# Patient Record
Sex: Male | Born: 1970 | Race: Black or African American | Hispanic: No | Marital: Married | Smoking: Current every day smoker
Health system: Southern US, Community
[De-identification: ages and names within clinical notes are randomized; demographics above are authoritative.]

## PROBLEM LIST (undated history)

## (undated) DIAGNOSIS — K219 Gastro-esophageal reflux disease without esophagitis: Secondary | ICD-10-CM

---

## 2002-08-02 ENCOUNTER — Emergency Department (HOSPITAL_COMMUNITY): Admission: EM | Admit: 2002-08-02 | Discharge: 2002-08-02 | Payer: Self-pay | Admitting: Emergency Medicine

## 2005-06-26 ENCOUNTER — Emergency Department (HOSPITAL_COMMUNITY): Admission: EM | Admit: 2005-06-26 | Discharge: 2005-06-26 | Payer: Self-pay | Admitting: Emergency Medicine

## 2006-09-11 ENCOUNTER — Ambulatory Visit: Payer: Self-pay | Admitting: Gastroenterology

## 2006-10-03 ENCOUNTER — Ambulatory Visit: Payer: Self-pay | Admitting: Gastroenterology

## 2008-05-10 ENCOUNTER — Inpatient Hospital Stay (HOSPITAL_COMMUNITY): Admission: EM | Admit: 2008-05-10 | Discharge: 2008-05-13 | Payer: Self-pay | Admitting: Emergency Medicine

## 2009-07-21 IMAGING — CR DG CHEST 2V
2 series · 2 of 2 positions shown · non-contrast
Comparison: None.

CLINICAL DATA: Smoker with shortness of breath.

CHEST - 2 VIEW

[w chest pa]
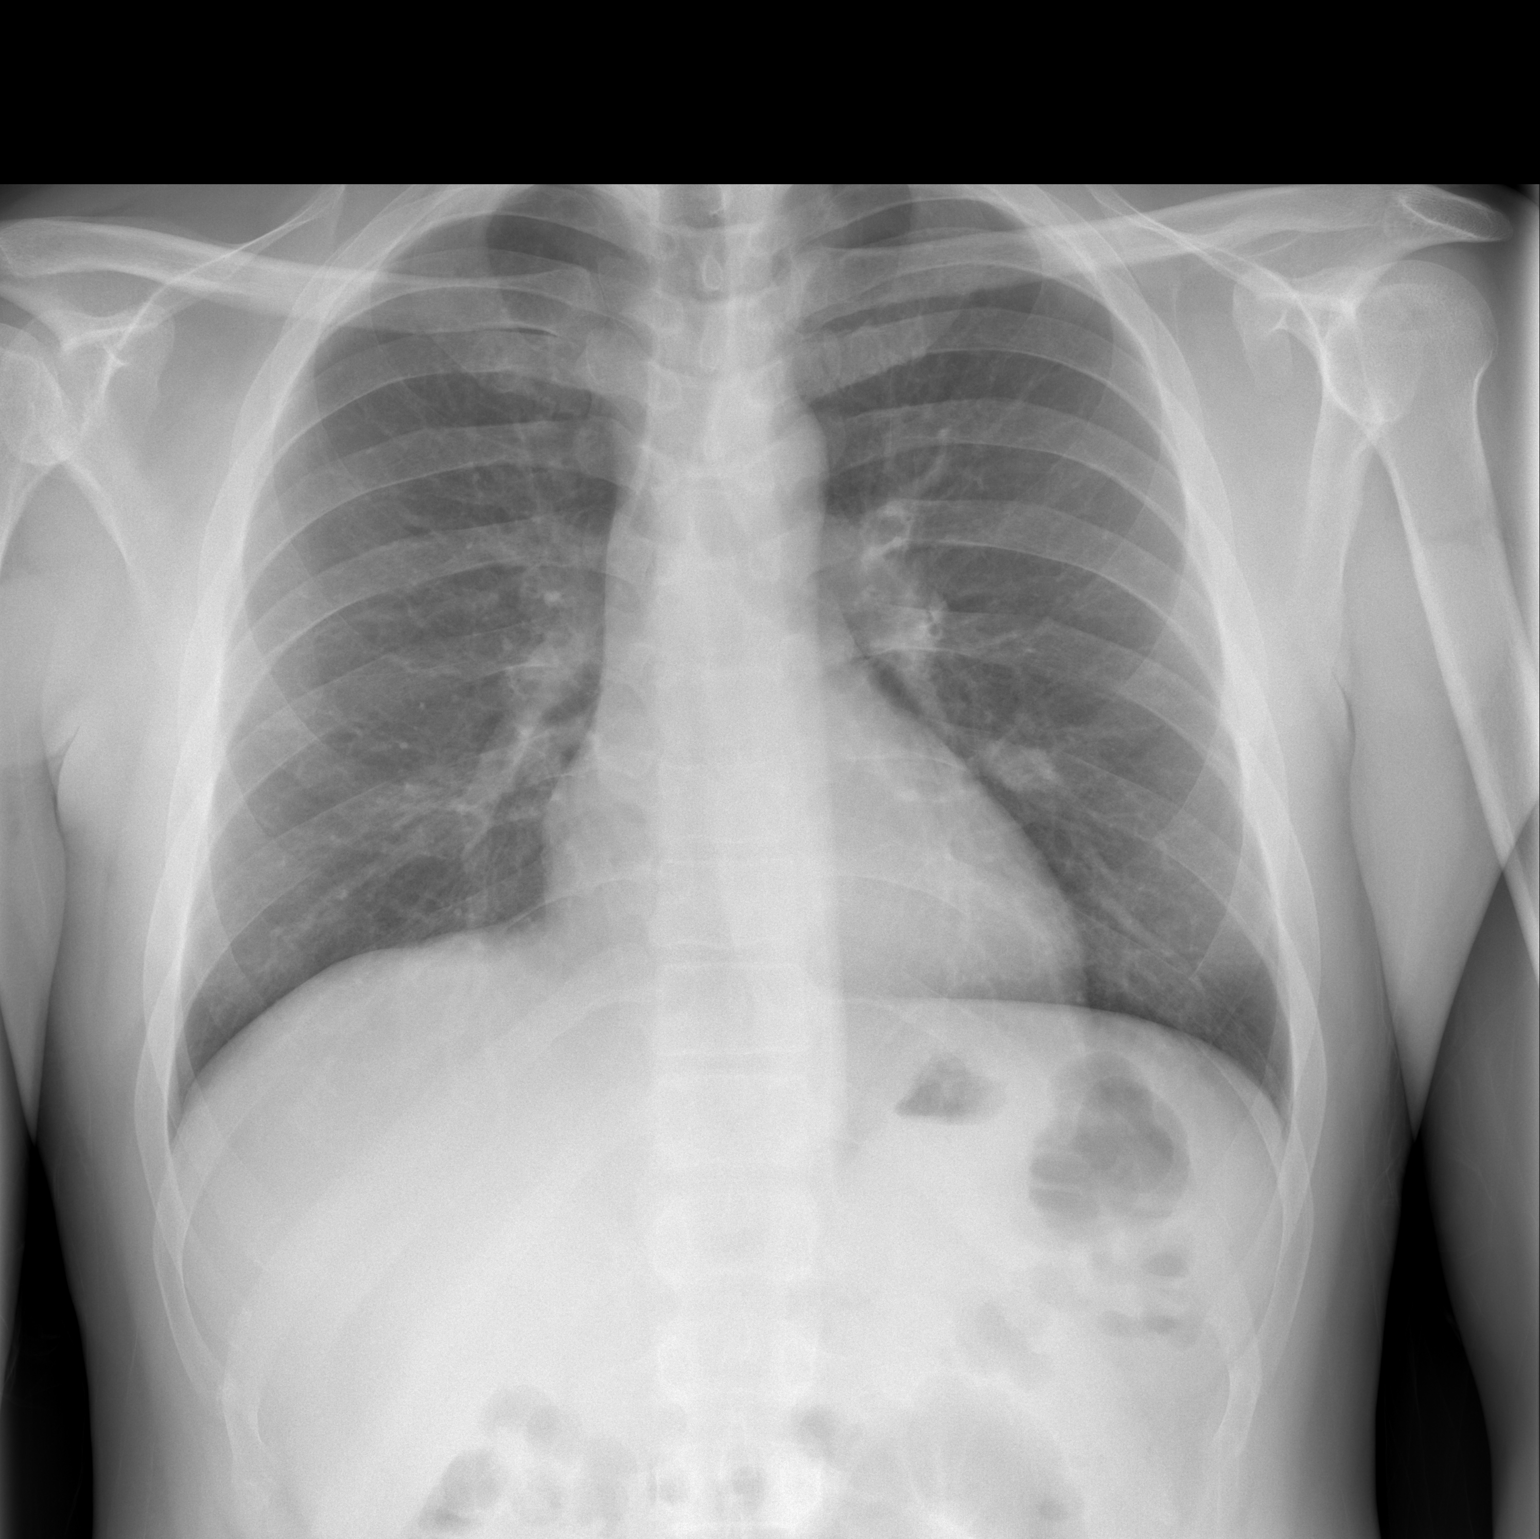

[w chest lat]
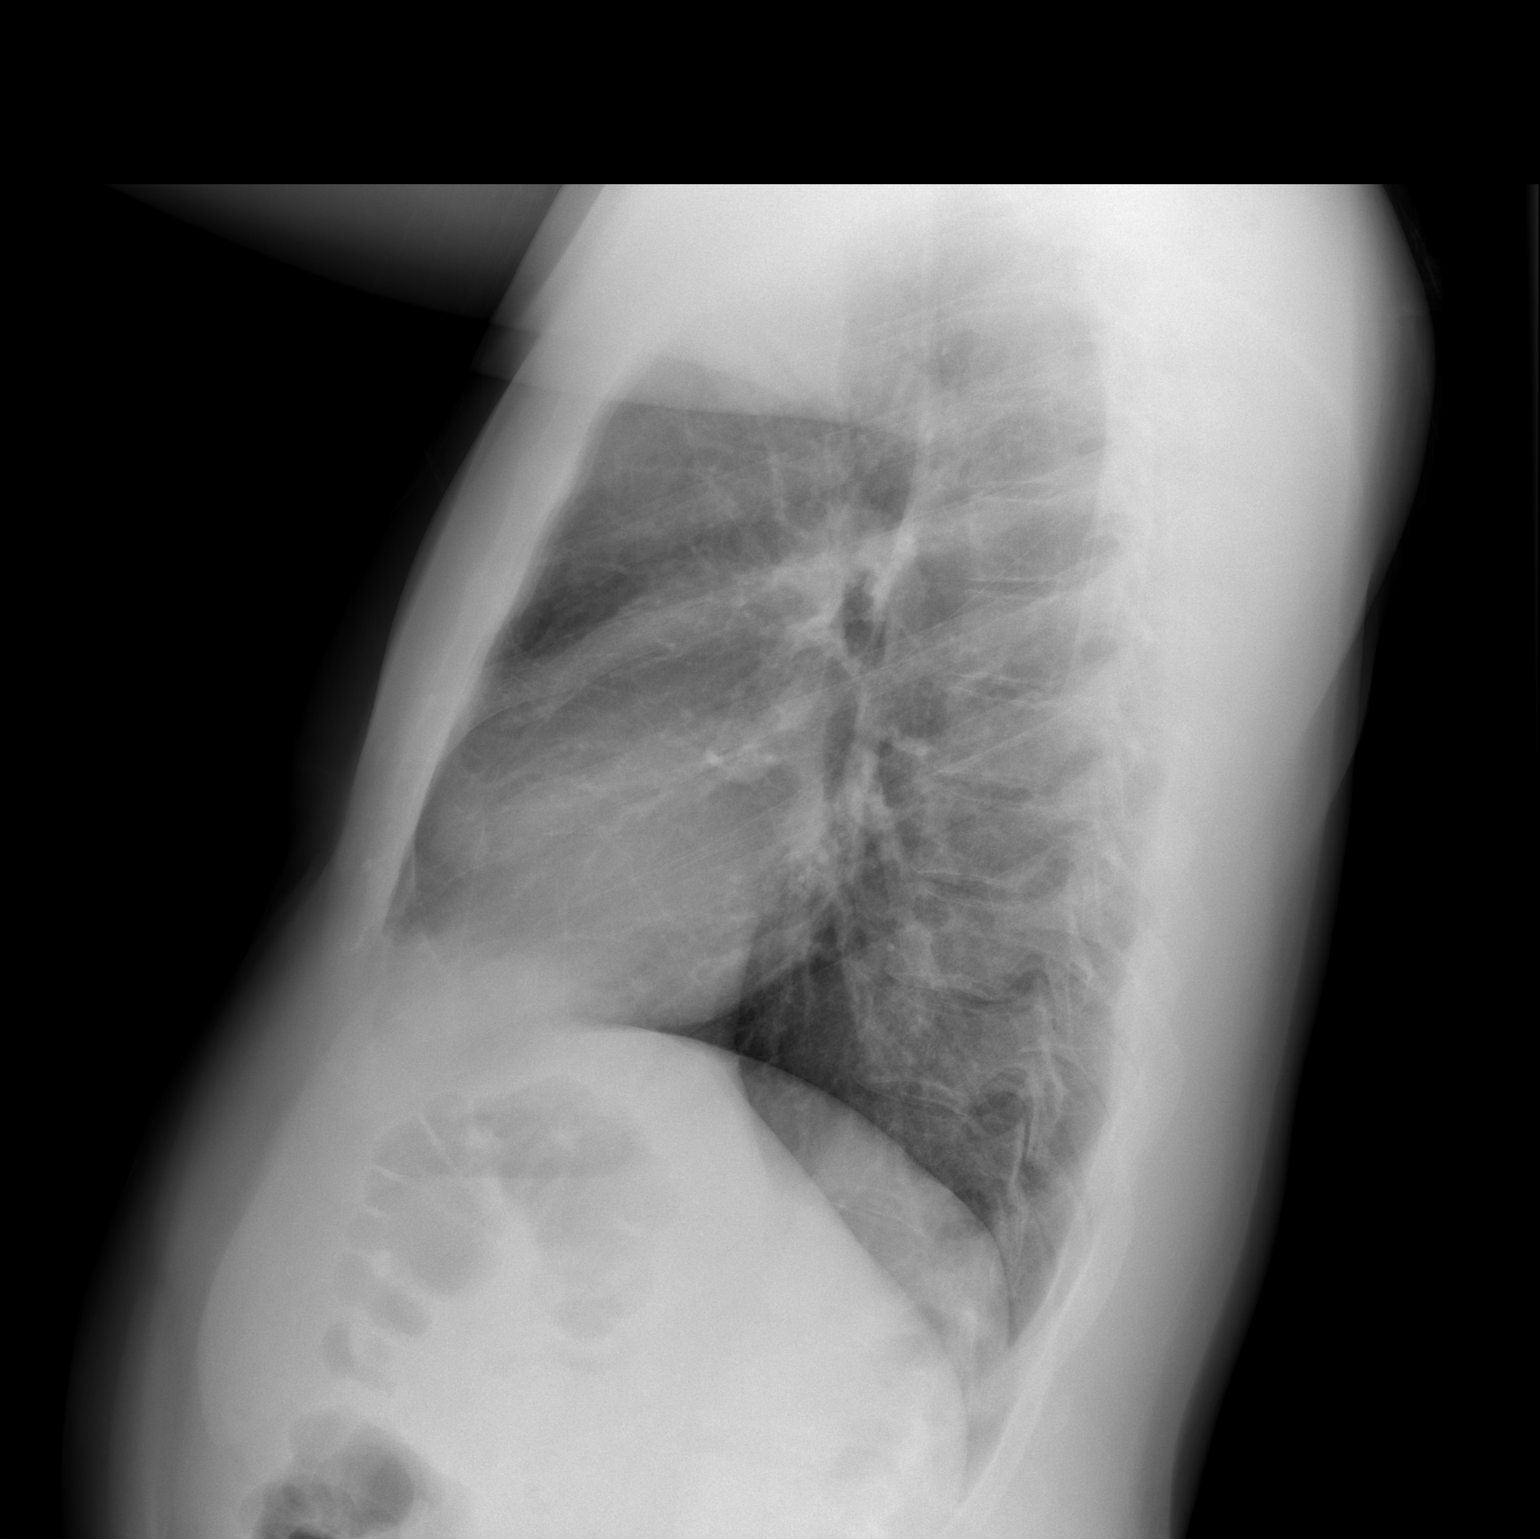

[2 of 2 positions shown; findings below may reference images not displayed]

FINDINGS: Normal sized heart.  Clear lungs.  Mild central
peribronchial thickening.  Unremarkable bones.
IMPRESSION: Mild bronchitic changes.

## 2011-05-07 NOTE — H&P (Signed)
NAMEHERMILO, DUTTER NO.:  [id number]   MEDICAL RECORD NO.:  [id number]          PATIENT TYPE:  EMS   LOCATION:  ED                           FACILITY[facility]  [facility]   PHYSICIAN:  Altha Harm, MDDATE OF BIRTH:  10/14/1971   DATE OF ADMISSION:  05/09/2008  DATE OF DISCHARGE:                              HISTORY & PHYSICAL   CHIEF COMPLAINT:  Abdominal pain.   HISTORY OF PRESENT ILLNESS:  This is a 40 year old gentleman who  presents to the emergency room today with complaints of crampy abdominal  pain in the periumbilical area.  The patient states that the pain is  intermittent and at the worse is at 10/10, and is now down to a 3/10.  The patient states the pain is not radiating and is not associated with  any other symptoms.  He is unable to identify any palliative or  provocative features associated with the pain.  There is no association  with eating; no association with bowel movements or urination.  The  patient has had normal formed bowel movements up until today, without  any overt evidence of blood.  He has no dysuria.  The patient states  that he has had similar episodes of this in the past, the last occurring  approximately 1 month ago.  However, the pain abated on its own without  necessity for any further medical intervention.  However, he states that  the difference today is that the pain is continuous and is excruciating,  at its greatest intensity.  There are no sick contacts at home.   PAST MEDICAL HISTORY:  Significant for gastroesophageal reflux disease.   FAMILY HISTORY:  Significant for colon cancer in an uncle.  Hypertension  in his mother.  Coronary artery disease and diabetes which caused his  father's death at age 74.   SOCIAL HISTORY:  Significant for tobacco, one pack per day times 16  years.  He drinks alcohol, approximately 48 ounces of beer per day.  He  does use marijuana, the last use being today.  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   MEDICATIONS:  Currently he is not on any medications.  He uses Mylanta  intermittently for dyspepsia.   ALLERGIES:  NO KNOWN DRUG ALLERGIES.   PRIMARY CARE PHYSICIAN:  None.   REVIEW OF SYSTEMS:  The 14 systems were reviewed, all systems are  negative except as noted in the HPI.   STUDIES:  Done in the emergency room and show the following:  The  patient has a white blood cell count of 16.7, hemoglobin 16.9,  hematocrit 48.3, platelet count 267.  Sodium 142, potassium 4.6,  chloride 107, bicarb 27, BUN 10, creatinine 1.23, and a blood glucose  109.  A urinalysis was negative for any elements consistent with a  urinary tract infection.  A CT of the abdomen and pelvis without  contrast shows no acute abnormalities.  A chest x-ray shows mild  bronchitic changes.   PHYSICAL EXAMINATION:  The patient is resting in bed comfortably and  appears in no distress.  The patient is also nontoxic appearing and is  able to answer questions without any difficulty.  He does state that he  feels hungry at this time.  VITAL SIGNS:  Temperature 98.2 orally, blood  pressure 128/82, heart rate 88, respiratory rate 15, O2 saturations 96%  on room air.  HEENT EXAMINATION:  He is normocephalic, atraumatic.  Pupils equally  round and reactive to light and accommodation.  Extraocular movements  were intact.  Tympanic membranes were translucent bilaterally with good  landmarks.  Oropharynx was moist.  No exudates or erythema lesions were  noted.  Trachea was midline.  No masses, no thyromegaly, no JVD, no  carotid bruit.  RESPIRATORY EXAMINATION:  the patient has a normal respiratory effort,  equal excursion bilaterally.  No wheezing or rhonchi noted.  CARDIOVASCULAR:  He has a normal S1 and S2; no murmurs, rubs or gallops  are noted.  PMI was nondisplaced.  No heaves or thrills on palpation.  ABDOMINAL EXAMINATION:  The patient's abdomen is obese, soft, nontender,   nondistended.  No masses, no hepatosplenomegaly.  LYMPH NODE SURVEY:  He had no cervical, axillary or inguinal  lymphadenopathy. MUSCULOSKELETAL:  He had no warm swelling or erythema  around the joints.  There was no joint tenderness and no spinal  tenderness noted.  NEUROLOGICAL:  Cranial nerves II-XII were grossly intact.  DTRs were 2+  bilaterally at the lower extremities.  Strength was 5/5 in bilateral  upper and lower extremities.  PSYCHIATRIC:  He was alert and oriented x3; good insight and cognition  and baseline to remote recall.   ASSESSMENT AND PLAN:  This patient presents with abdominal cramping.  I  suspect that this is likely somehow related to peptic ulcer disease  process.  However, the elevated white blood cell count with a left shift  is concerning.  We will admit the patient and observe him overnight.  If  there is no further escalations in his symptoms, I think the patient can  probably be safely be discharged home; as long  as his white blood cell  count resolves and he does not encounter any further clinical  deterioration.  The patient really has no tenderness and he is not  affected by his eating.  The patient does have a strong history of colon  cancer in his family.  He does have risk factors including alcohol and  tobacco use for peptic ulcer disease, and the patient would likely  benefit from an endoscopic evaluation.  However, I am not convinced that  this needs to occur within the hospital; he  will probably be set up as  an outpatient.  We will observe the patient over the next 12-24 hours.  Further decisions on his care will be made by his rounding physician,  based upon the patient's initial response to therapy and the unfolding  of his clinical course.      Altha Harm, MD  Electronically Signed     MAM/MEDQ  D:  05/09/2008  T:  05/09/2008  Job:  [id number]

## 2011-05-07 NOTE — Consult Note (Signed)
NAMEBURGESS, SHERIFF               ACCOUNT NO.:  [id number]   MEDICAL RECORD NO.:  [id number]          PATIENT TYPE:  INP   LOCATION:  1513                         FACILITY:  [facility]   PHYSICIAN:  Shirley Friar, MDDATE OF BIRTH:  03/03/1971   DATE OF CONSULTATION:  DATE OF DISCHARGE:                                 CONSULTATION   REASON FOR CONSULTATION:  Abdominal pain.   HISTORY OF PRESENT ILLNESS:  A 40 year old black male with history of  reflux, being seen secondary to periumbilical abdominal pain.  He  describes his abdominal pain as intermittent, crampy in nature that is  unrelated to eating or bowel movements.  When the pain comes on, it  usually lasts a few seconds to minutes and then resolves for several  weeks to months.  He reports this periumbilical abdominal pain has been  intermittent for the last several months and describes it as like my  guts are being pulled out.  He denies any fevers, chills, weight loss,  diarrhea, melena, hematochezia, nausea or vomiting.  Does take NSAIDs  daily secondary to headaches and has done so for years.  His white blood  count on admission was 16.7 and currently 10.6 with a normal hemoglobin.  He had abdominal pelvic CT scan done on the 18th which was negative for  any acute findings.  He states that he is still having intermittent  abdominal pain that is relieved with pain medicines.   In 2007, he saw Dr. Jarold Motto in [facility] for crampy upper  abdominal pain.  At that time, the thought was that his NSAIDS could be  associated with his symptoms.  He was recommended to have an endoscopy  and colonoscopy.  He says he never got it done because of insurance  problems.   PAST MEDICAL HISTORY:  Gastroesophageal reflux disease.   MEDICATIONS:  NSAIDs daily, Mylanta p.r.n.   ALLERGIES:  NO KNOWN DRUG ALLERGIES.   FAMILY HISTORY:  Uncle had colon cancer.  No other known history of  colon cancer.   SOCIAL HISTORY:  Positive  alcohol 40 ounces of beer per day, positive  marijuana, denies cocaine, positive tobacco.   REVIEW OF SYSTEMS:  Negative from GI standpoint except as stated above.   PHYSICAL EXAMINATION:  VITAL SIGNS:  Temperature 98.2, pulse 50, blood  pressure 113/73, O2 sat 98% on room air.  GENERAL:  Alert, no acute distress.  ABDOMEN:  Soft, nontender, nondistended.  Positive bowel sounds.   LABORATORY DATA:  White blood count 16.7 on May 09, 2008, currently  10.6, hemoglobin 15.5, platelet count 236.  Lipase 21.  All other labs  as listed in hospital record.   IMPRESSION:  A 40 year old black male with intermittent periumbilical  abdominal pain with negative CT scan.  I think his nonsteroidal anti-  inflammatory drug use could be causing irritation of his lining of his  gut and that could be contributing to his pain.  He could have an ulcer  in his lower GI tract.  I do not think he has an upper GI ulcer, or at  least  I do not think that is causing his pain.  He needs to stop all  NSAID use.  His pain is unrelated to eating, and he tolerated a regular  diet on 05/09/2008 in the hospital and therefore will resume his diet  and see how he does.  We will plan to do an upper GI series and small-  bowel follow-through on May 12, 2008.      Shirley Friar, MD  Electronically Signed     VCS/MEDQ  D:  05/11/2008  T:  05/11/2008  Job:  [id number]

## 2011-05-10 NOTE — Assessment & Plan Note (Signed)
[facility]                           GASTROENTEROLOGY OFFICE NOTE   Peter Harrison, Peter Harrison                        MRN:          [id number]  DATE:09/11/2006                            DOB:          1971/05/28    Mr. Peter Harrison is a 40 year old black male, employee of [employer].  He is self-referred today for evaluation of crampy upper abdominal pain  which has been present for several months with mild nausea but no emesis.  He apparently has chronic acid reflux which is managed with p.r.n. antacids,  and was seen at [facility] in 1997, apparently had an  upper GI series.  He denies acid reflux symptoms at this time or extra  esophageal manifestations of GERD.  He specifically denies dysphagia,  anorexia, or weight loss.  His crampy abdominal pain seems to be present  upon waking in the morning and is worse with eating.  He has had no weight  loss, however.  Denies melena or hematochezia.  He has had rectal pain, some  bright red blood per rectum associated with constipation.  He denies  specific food intolerances.  He does use a large amount of NSAIDs because of  headaches and other aches and pains.  He has never had endoscopic exams.   PAST MEDICAL HISTORY:  1. Long history of cigarette and alcohol abuse.  2. Denies previous episodes of hepatitis or pancreatitis.  3. He has never had previous abdominal or general surgical procedures.   FAMILY HISTORY:  Noncontributory in terms of gastrointestinal problems.   MEDICATIONS:  None.   ALLERGIES:  None.   SOCIAL HISTORY:  The patient is married and has 5 children.  He lives with  his current wife and 2 children.  He smoked a pack a day of cigarettes for  some 17 years and drinks a 6-pack a day over the last 10 years.  He also  uses marijuana but denies illicit IV drug use.   REVIEW OF SYSTEMS:  Negative today for any cardiovascular, pulmonary,  genitourinary, neurologic,  orthopedic, endocrine, or neuropsychiatric  problems.  Review of systems otherwise noncontributory.  The patient does  not see physicians, has not had blood work done in many years.   PHYSICAL EXAMINATION:  GENERAL:  He is a healthy-appearing black male  appearing his stated age.  VITAL SIGNS:  He is 5 feet 7 inches tall and weighs 171 pounds.  Blood  pressure is 118/78, and pulse was 80 and regular.  I could not appreciate  stigmata of chronic liver disease or thyromegaly.  CHEST:  Entirely clear to percussion and auscultation anteriorly and  posteriorly.  HEART:  He was in a regular rhythm without murmurs, gallops, or rubs noted.  ABDOMEN:  I could not appreciated hepatosplenomegaly, abdominal masses, or  tenderness.  EXTREMITIES:  Unremarkable.  MENTAL STATUS:  Normal.  RECTAL:  Attempts at rectal exam were unsuccessful because of marked rectal  sensitivity.  I could see no definite fissures or fistulae.   ASSESSMENT:  1. Probable nonsteroidal anti-inflammatory drug-induced gastric ulceration      with  associated abdominal pain.  2. Long history of acid reflux disease with frequent antacid use - rule      out Barrett's mucosa versus esophageal malignancy per his history of      alcohol and cigarette abuse.  3. Rule out cholelithiasis.  4. History of chronic ethanol and cigarette abuse without known sequelae      at this time.  5. Probable rectal fissure associated with constipation.  6. Rectal bleeding secondary probably to #5.  7. History of frequent headaches requiring nonsteroidal anti-inflammatory      drug use.   RECOMMENDATIONS:  1. Check CBC and metabolic profile.  2. Outpatient ultrasound, endoscopy, and colonoscopy.  3. Konsyl kit with local steroid - Xylocaine cream applicators twice      daily.  4. Start Prevacid 30 mg 30 minutes before the first meal of the day.  5. Discontinue nonsteroidal anti-inflammatory drug and aspirin use and      have given him a limited  supply of Darvocet-N 100 to use p.r.n.  The      patient may need referral to headache clinic.                                   Vania Rea. Jarold Motto, MD, Clementeen Graham, [place]   DRP/MedQ  DD:  09/11/2006  DT:  09/12/2006  Job #:  [id number]

## 2011-05-10 NOTE — Discharge Summary (Signed)
Peter Harrison, Peter Harrison               ACCOUNT NO.:  [id number]   MEDICAL RECORD NO.:  [id number]          PATIENT TYPE:  INP   LOCATION:  1513                         FACILITY:  [facility]   PHYSICIAN:  Hillery Aldo, M.D.   DATE OF BIRTH:  10/01/1971   DATE OF ADMISSION:  05/09/2008  DATE OF DISCHARGE:  05/13/2008                               DISCHARGE SUMMARY   PRIMARY CARE PHYSICIAN:  None.   GASTROENTEROLOGIST:  Vania Rea. Jarold Motto, MD, Clementeen Graham, FACP, FAGA.   DISCHARGE DIAGNOSES:  1. Abdominal pain.  2. Leukocytosis.  3. Gastroesophageal reflux disease.  4. Tobacco abuse.  5. Daily alcohol use.  6. Marijuana abuse.   DISCHARGE MEDICATIONS:  1. Bentyl 20 mg q.6 h p.r.n. abdominal pain.  2. Protonix 40 mg daily.  3. Oxycodone 5 mg q.6 h p.r.n. pain.   CONSULTATIONS:  1. Shirley Friar, MD, of gastroenterology.  2. Anselm Pancoast. Zachery Dakins, M.D., of general surgery.   PROCEDURES AND DIAGNOSTIC STUDIES:  1. CT scan of the abdomen and pelvis on May 09, 2008, showed no acute      abdominal or pelvic findings.  2. One-view of the abdomen on May 12, 2008, showed no acute findings.  3. Upper GI with small-bowel follow-through on May 13, 2008, showed a      small hiatal hernia.  No esophageal stricture.  Spontaneous      gastroesophageal reflux was noted to the level of the mid      esophagus.  Unremarkable stomach in duodenum.  Unremarkable small      bowel.  No small bowel obstruction.   DISCHARGE LABORATORY VALUES:  A basic metabolic panel and CBC were  within normal limits.   HOSPITAL COURSE BY PROBLEM:  1. Abdominal pain:  The patient was admitted and treated      conservatively.  GI consultation was requested and kindly provided      by Dr. Bosie Clos.  He also was seen in consultation with general      surgery, Dr. Zachery Dakins, who did not feel there was any evidence of      a surgical abdomen. Differential diagnosis for the patient's pain      was felt to be possible  NSAID-induced ulcer and given this, an      upper GI series with small-bowel follow-through was scheduled.      Findings were as noted above.  No further diagnostic evaluation was      deemed necessary and the patient was continued to be treated      conservatively with antispasmodic medications.  Over the course of      his hospital stay, the abdominal pain improved but did not      completely resolve.  He was scheduled for follow up with Dr.      Bosie Clos in three week's time.  2. Leukocytosis:  The patient's leukocytosis resolved without any      specific intervention.  3. Tobacco use:  The patient was counseled on cessation.  4. Marijuana abuse:  The patient was counseled on cessation.  5. Alcohol use:  The patient was  advised to limit his intake.   DISPOSITION:  The patient was deemed medically stable for discharge on  May 13, 2008.  He was advised to follow up Dr. Bosie Clos in 3 weeks.      Hillery Aldo, M.D.  Electronically Signed     CR/MEDQ  D:  07/27/2008  T:  07/27/2008  Job:  [id number]   cc:   Vania Rea. Jarold Motto, MD, FACG, FACP, FAGA  [address]  [place]  [place] [address]

## 2011-09-18 LAB — DIFFERENTIAL
Basophils Relative: 1
Eosinophils Absolute: 0.3
Eosinophils Absolute: 0.3
Eosinophils Relative: 2
Lymphs Abs: 3.2
Lymphs Abs: 4.5 — ABNORMAL HIGH
Monocytes Absolute: 0.5
Monocytes Absolute: 0.7
Monocytes Relative: 3
Monocytes Relative: 6

## 2011-09-18 LAB — CBC
Hemoglobin: 15.1
Hemoglobin: 15.2
Hemoglobin: 15.5
MCHC: 34.5
MCHC: 34.6
MCHC: 34.6
MCHC: 35
MCV: 85
MCV: 85.2
Platelets: 267
RBC: 5.11
RBC: 5.7
RDW: 14.5
RDW: 14.6
WBC: 12.4 — ABNORMAL HIGH

## 2011-09-18 LAB — BASIC METABOLIC PANEL
CO2: 27
Calcium: 9.3
Creatinine, Ser: 1.15
GFR calc Af Amer: >60
GFR calc non Af Amer: >60
Glucose, Bld: 97
Sodium: 141

## 2011-09-18 LAB — URINALYSIS, ROUTINE W REFLEX MICROSCOPIC
Nitrite: NEGATIVE
Specific Gravity, Urine: 1.041 — ABNORMAL HIGH
Urobilinogen, UA: 1

## 2011-09-18 LAB — COMPREHENSIVE METABOLIC PANEL
ALT: 32
AST: 24
Albumin: 4.3
CO2: 27
Calcium: 9.7
GFR calc Af Amer: >60
Sodium: 142
Total Protein: 7.2

## 2015-05-10 ENCOUNTER — Emergency Department (HOSPITAL_COMMUNITY)
Admission: EM | Admit: 2015-05-10 | Discharge: 2015-05-10 | Disposition: A | Discharge status: Home / Self Care | Payer: Self-pay | Attending: Emergency Medicine | Admitting: Emergency Medicine

## 2015-05-10 ENCOUNTER — Emergency Department (HOSPITAL_COMMUNITY): Payer: Self-pay

## 2015-05-10 ENCOUNTER — Encounter (HOSPITAL_COMMUNITY): Payer: Self-pay

## 2015-05-10 DIAGNOSIS — R1084 Generalized abdominal pain: Secondary | ICD-10-CM | POA: Insufficient documentation

## 2015-05-10 DIAGNOSIS — Z8719 Personal history of other diseases of the digestive system: Secondary | ICD-10-CM | POA: Insufficient documentation

## 2015-05-10 DIAGNOSIS — Z720 Tobacco use: Secondary | ICD-10-CM | POA: Insufficient documentation

## 2015-05-10 DIAGNOSIS — Z791 Long term (current) use of non-steroidal anti-inflammatories (NSAID): Secondary | ICD-10-CM | POA: Insufficient documentation

## 2015-05-10 HISTORY — DX: Gastro-esophageal reflux disease without esophagitis: K21.9

## 2015-05-10 LAB — URINALYSIS, ROUTINE W REFLEX MICROSCOPIC
Bilirubin Urine: NEGATIVE
GLUCOSE, UA: NEGATIVE mg/dL
HGB URINE DIPSTICK: NEGATIVE
Ketones, ur: NEGATIVE mg/dL
LEUKOCYTES UA: NEGATIVE
Nitrite: NEGATIVE
PH: 6.5 (ref 5.0–8.0)
PROTEIN: NEGATIVE mg/dL
SPECIFIC GRAVITY, URINE: 1.013 (ref 1.005–1.030)
Urobilinogen, UA: 0.2 mg/dL (ref 0.0–1.0)

## 2015-05-10 LAB — CBC WITH DIFFERENTIAL/PLATELET
BASOS ABS: 0.1 10*3/uL (ref 0.0–0.1)
Basophils Relative: 0 % (ref 0–1)
EOS ABS: 0.2 10*3/uL (ref 0.0–0.7)
Eosinophils Relative: 2 % (ref 0–5)
HCT: 47.4 % (ref 39.0–52.0)
Hemoglobin: 16.9 g/dL (ref 13.0–17.0)
LYMPHS ABS: 2.6 10*3/uL (ref 0.7–4.0)
Lymphocytes Relative: 22 % (ref 12–46)
MCH: 29.8 pg (ref 26.0–34.0)
MCHC: 35.7 g/dL (ref 30.0–36.0)
MCV: 83.5 fL (ref 78.0–100.0)
Monocytes Absolute: 0.7 10*3/uL (ref 0.1–1.0)
Monocytes Relative: 6 % (ref 3–12)
NEUTROS ABS: 8.1 10*3/uL — AB (ref 1.7–7.7)
NEUTROS PCT: 70 % (ref 43–77)
PLATELETS: 299 10*3/uL (ref 150–400)
RBC: 5.68 MIL/uL (ref 4.22–5.81)
RDW: 15.2 % (ref 11.5–15.5)
WBC: 11.6 10*3/uL — ABNORMAL HIGH (ref 4.0–10.5)

## 2015-05-10 LAB — COMPREHENSIVE METABOLIC PANEL
ALK PHOS: 81 U/L (ref 38–126)
ALT: 28 U/L (ref 17–63)
AST: 22 U/L (ref 15–41)
Albumin: 4.2 g/dL (ref 3.5–5.0)
Anion gap: 11 (ref 5–15)
BUN: 12 mg/dL (ref 6–20)
CALCIUM: 9.3 mg/dL (ref 8.9–10.3)
CO2: 21 mmol/L — AB (ref 22–32)
Chloride: 108 mmol/L (ref 101–111)
Creatinine, Ser: 1.03 mg/dL (ref 0.61–1.24)
GFR calc Af Amer: >60 mL/min (ref >60)
GLUCOSE: 106 mg/dL — AB (ref 65–99)
Potassium: 4.2 mmol/L (ref 3.5–5.1)
SODIUM: 140 mmol/L (ref 135–145)
Total Bilirubin: 0.7 mg/dL (ref 0.3–1.2)
Total Protein: 7.2 g/dL (ref 6.5–8.1)

## 2015-05-10 LAB — I-STAT CG4 LACTIC ACID, ED: LACTIC ACID, VENOUS: 1.08 mmol/L (ref 0.5–2.0)

## 2015-05-10 LAB — LIPASE, BLOOD: LIPASE: 18 U/L — AB (ref 22–51)

## 2015-05-10 MED ORDER — HYDROCODONE-ACETAMINOPHEN 5-325 MG PO TABS
2.0000 | ORAL_TABLET | Freq: Once | ORAL | Status: AC
  Administered 2015-05-10: 2 via ORAL
  Filled 2015-05-10: qty 2

## 2015-05-10 MED ORDER — OMEPRAZOLE 20 MG PO CPDR: 20.0000 mg | DELAYED_RELEASE_CAPSULE | Freq: Every day | ORAL | Status: AC

## 2015-05-10 MED ORDER — KETOROLAC TROMETHAMINE 30 MG/ML IJ SOLN
30.0000 mg | Freq: Once | INTRAMUSCULAR | Status: AC
  Administered 2015-05-10: 30 mg via INTRAVENOUS
  Filled 2015-05-10: qty 1

## 2015-05-10 MED ORDER — GI COCKTAIL ~~LOC~~
30.0000 mL | Freq: Once | ORAL | Status: AC
  Administered 2015-05-10: 30 mL via ORAL
  Filled 2015-05-10: qty 30

## 2015-05-10 MED ORDER — IOHEXOL 300 MG/ML  SOLN
100.0000 mL | Freq: Once | INTRAMUSCULAR | Status: AC | PRN
Start: 2015-05-10 — End: 2015-05-10
  Administered 2015-05-10: 100 mL via INTRAVENOUS

## 2015-05-10 MED ORDER — IOHEXOL 300 MG/ML  SOLN
50.0000 mL | Freq: Once | INTRAMUSCULAR | Status: AC | PRN
  Administered 2015-05-10: 50 mL via ORAL

## 2015-05-10 MED ORDER — SODIUM CHLORIDE 0.9 % IV BOLUS (SEPSIS)
1000.0000 mL | Freq: Once | INTRAVENOUS | Status: AC
  Administered 2015-05-10: 1000 mL via INTRAVENOUS

## 2015-05-10 MED ORDER — HYDROMORPHONE HCL 1 MG/ML IJ SOLN
0.5000 mg | Freq: Once | INTRAMUSCULAR | Status: AC
  Administered 2015-05-10: 0.5 mg via INTRAVENOUS
  Filled 2015-05-10: qty 1

## 2015-05-10 MED ORDER — HYDROCODONE-ACETAMINOPHEN 5-325 MG PO TABS: 1.0000 | ORAL_TABLET | Freq: Four times a day (QID) | ORAL | Status: AC | PRN

## 2015-05-10 MED ORDER — FAMOTIDINE IN NACL 20-0.9 MG/50ML-% IV SOLN
20.0000 mg | Freq: Once | INTRAVENOUS | Status: AC
  Administered 2015-05-10: 20 mg via INTRAVENOUS
  Filled 2015-05-10: qty 50

## 2015-05-10 MED ORDER — SUCRALFATE 1 GM/10ML PO SUSP: 1.0000 g | Freq: Three times a day (TID) | ORAL | Status: AC

## 2015-05-10 NOTE — Discharge Instructions (Signed)
As discussed, your evaluation today has been largely reassuring.  But, it is important that you monitor your condition carefully, and do not hesitate to return to the ED if you develop new, or concerning changes in your condition.  Your pain is likely coming from inflammation, whether of the lining of the stomach itself, with the abdominal wall.  Additional evaluation requires the assistance of our gastroenterology colleagues.  Please call today for the next available appointment.   Abdominal Pain Many things can cause abdominal pain. Usually, abdominal pain is not caused by a disease and will improve without treatment. It can often be observed and treated at home. Your health care provider will do a physical exam and possibly order blood tests and X-rays to help determine the seriousness of your pain. However, in many cases, more time must pass before a clear cause of the pain can be found. Before that point, your health care provider may not know if you need more testing or further treatment. HOME CARE INSTRUCTIONS  Monitor your abdominal pain for any changes. The following actions may help to alleviate any discomfort you are experiencing:  Only take over-the-counter or prescription medicines as directed by your health care provider.  Do not take laxatives unless directed to do so by your health care provider.  Try a clear liquid diet (broth, tea, or water) as directed by your health care provider. Slowly move to a bland diet as tolerated. SEEK MEDICAL CARE IF:  You have unexplained abdominal pain.  You have abdominal pain associated with nausea or diarrhea.  You have pain when you urinate or have a bowel movement.  You experience abdominal pain that wakes you in the night.  You have abdominal pain that is worsened or improved by eating food.  You have abdominal pain that is worsened with eating fatty foods.  You have a fever. SEEK IMMEDIATE MEDICAL CARE IF:   Your pain does not  go away within 2 hours.  You keep throwing up (vomiting).  Your pain is felt only in portions of the abdomen, such as the right side or the left lower portion of the abdomen.  You pass bloody or black tarry stools. MAKE SURE YOU:  Understand these instructions.   Will watch your condition.   Will get help right away if you are not doing well or get worse.  Document Released: 09/18/2005 Document Revised: 12/14/2013 Document Reviewed: 08/18/2013 [facility] Patient Information 2015 [employer], [place]. This information is not intended to replace advice given to you by your health care provider. Make sure you discuss any questions you have with your health care provider.

## 2015-05-10 NOTE — ED Notes (Signed)
Pt presents with c/o central abdomina pain that started this morning. Pt denies any N/V/D. Pt reports he was seen six years ago for the same thing and reports they were unable to tell him what was going on.

## 2015-05-10 NOTE — ED Provider Notes (Signed)
CSN: [id number]     Arrival date & time 05/10/15  [id number] History   First MD Initiated Contact with Patient 05/10/15 [phone]     Chief Complaint  Patient presents with  . Abdominal Pain     (Consider location/radiation/quality/duration/timing/severity/associated sxs/prior Treatment) HPI patient presents with concern of new abdominal pain. Pain awoke him approximately 5 hours ago, with severe sharp crampy pain in the mid abdomen. Since onset pain has been inconsistent, occurring, receding without clear precipitant, any clear alleviating or exacerbating factors. Pain is nonradiating.  There is no associated nausea, vomiting, diarrhea, urinary changes. Patient was in his usual state of health prior to the onset of symptoms. Patient had one prior similar episode about 7 years ago, without clear diagnosis. No history of abdominal surgery. Patient does take NSAID for back pain.  Past Medical History  Diagnosis Date  . GERD (gastroesophageal reflux disease)    History reviewed. No pertinent past surgical history. No family history on file. History  Substance Use Topics  . Smoking status: Current Every Day Smoker  . Smokeless tobacco: Not on file  . Alcohol Use: Yes     Comment: daily     Review of Systems  Constitutional:       Per HPI, otherwise negative  HENT:       Per HPI, otherwise negative  Respiratory:       Per HPI, otherwise negative  Cardiovascular:       Per HPI, otherwise negative  Gastrointestinal: Negative for vomiting.  Endocrine:       Negative aside from HPI  Genitourinary:       Neg aside from HPI   Musculoskeletal:       Per HPI, otherwise negative  Skin: Negative.   Neurological: Negative for syncope.      Allergies  Review of patient's allergies indicates no known allergies.  Home Medications   Prior to Admission medications   Medication Sig Start Date End Date Taking? Authorizing Provider  naproxen sodium (ANAPROX) 220 MG tablet Take 660 mg by  mouth 3 (three) times daily as needed (pain).   Yes Historical Provider, MD   BP 132/89 mmHg  Pulse 65  Temp(Src) 98.2 F (36.8 C) (Oral)  Resp 24  SpO2 100% Physical Exam  Constitutional: He is oriented to person, place, and time. He appears well-developed. No distress.  HENT:  Head: Normocephalic and atraumatic.  Eyes: Conjunctivae and EOM are normal.  Cardiovascular: Normal rate and regular rhythm.   Pulmonary/Chest: Effort normal. No stridor. No respiratory distress.  Abdominal: He exhibits no distension. There is no tenderness.  No current pain, so the patient indicates that periumbilical area is where it was sore before  Musculoskeletal: He exhibits no edema.  Neurological: He is alert and oriented to person, place, and time.  Skin: Skin is warm and dry.  Psychiatric: He has a normal mood and affect.  Nursing note and vitals reviewed.   ED Course  Procedures (including critical care time) Labs Review Labs Reviewed  CBC WITH DIFFERENTIAL/PLATELET - Abnormal; Notable for the following:    WBC 11.6 (*)    Neutro Abs 8.1 (*)    All other components within normal limits  COMPREHENSIVE METABOLIC PANEL - Abnormal; Notable for the following:    CO2 21 (*)    Glucose, Bld 106 (*)    All other components within normal limits  LIPASE, BLOOD - Abnormal; Notable for the following:    Lipase 18 (*)    All other  components within normal limits  URINALYSIS, ROUTINE W REFLEX MICROSCOPIC  I-STAT CG4 LACTIC ACID, ED    Imaging Review Ct Abdomen Pelvis W Contrast  05/10/2015   CLINICAL DATA:  44 year old male with periumbilical pain  EXAM: CT ABDOMEN AND PELVIS WITH CONTRAST  TECHNIQUE: Multidetector CT imaging of the abdomen and pelvis was performed using the standard protocol following bolus administration of intravenous contrast.  CONTRAST:  100mL OMNIPAQUE IOHEXOL 300 MG/ML SOLN, 50mL OMNIPAQUE IOHEXOL 300 MG/ML SOLN  COMPARISON:  Prior CT abdomen/ pelvis 05/09/2008  FINDINGS:  Lower Chest: The lung bases are clear. Visualized cardiac structures are within normal limits for size. No pericardial effusion. Unremarkable visualized distal thoracic esophagus.  Abdomen: Unremarkable CT appearance of the stomach, duodenum, spleen, adrenal glands and pancreas. Normal hepatic contour and morphology. Geographic hypoattenuation in the left hemi-liver adjacent to the fissure for the falciform ligament is nonspecific but most suggestive of benign focal fatty infiltration. No additional discrete lesion. Gallbladder is unremarkable. No intra or extrahepatic biliary ductal dilatation.  Unremarkable appearance of the bilateral kidneys. No focal solid lesion, hydronephrosis or nephrolithiasis. No evidence of obstruction or focal bowel wall thickening. Normal appendix in the right lower quadrant. The terminal ileum is unremarkable.  Pelvis: Unremarkable bladder, prostate gland and seminal vesicles. No free fluid or suspicious adenopathy.  Bones/Soft Tissues: No acute fracture or aggressive appearing lytic or blastic osseous lesion.  Vascular: No significant atherosclerotic vascular disease, aneurysmal dilatation or acute abnormality.  IMPRESSION: 1. No acute abnormality in the abdomen or pelvis to explain the patient's clinical symptoms. 2. Mild focal fatty infiltration within the left liver.   Electronically Signed   By: Malachy MoanHeath  McCullough M.D.   On: 05/10/2015 13:33     11:05 AM Patient continues to c/o pain.  Chart review show eval for abd pain w reassuring results in 2009  2:10 PM I reviewed the results (including imaging as performed), agree with the interpretation  On repeat exam the patient appears better.  We reviewed all findings.    MDM   Patient presents with new abdominal pain. Here the patient is awake, alert, hemodynamically stable. Given the patient's description of severe pain, CT scans performed. This was largely reassuring. Given the patient's persistent intermittent  pain, he was started on a course of medications.  With likely gastric etiology, he was referred to our GI colleagues for additional evaluation and management. No hypoxia, chest pain, tachypnea suggestive of either ACS or PE. No evidence for ongoing infection, beyond mild leukocytosis.     Gerhard Munchobert Nekeya Briski, MD 05/10/15 [phone]

## 2017-02-03 ENCOUNTER — Emergency Department (HOSPITAL_COMMUNITY): Payer: Self-pay

## 2017-02-03 ENCOUNTER — Emergency Department (HOSPITAL_COMMUNITY)
Admission: EM | Admit: 2017-02-03 | Discharge: 2017-02-03 | Disposition: A | Discharge status: Home / Self Care | Payer: Self-pay | Attending: Emergency Medicine | Admitting: Emergency Medicine

## 2017-02-03 ENCOUNTER — Encounter (HOSPITAL_COMMUNITY): Payer: Self-pay | Admitting: Emergency Medicine

## 2017-02-03 DIAGNOSIS — F17200 Nicotine dependence, unspecified, uncomplicated: Secondary | ICD-10-CM | POA: Insufficient documentation

## 2017-02-03 DIAGNOSIS — M5440 Lumbago with sciatica, unspecified side: Secondary | ICD-10-CM | POA: Insufficient documentation

## 2017-02-03 LAB — CBG MONITORING, ED: Glucose-Capillary: 108 mg/dL — ABNORMAL HIGH (ref 65–99)

## 2017-02-03 MED ORDER — TRAMADOL HCL 50 MG PO TABS: 50.0000 mg | ORAL_TABLET | Freq: Four times a day (QID) | ORAL | 0 refills | Status: AC | PRN

## 2017-02-03 MED ORDER — NAPROXEN 500 MG PO TABS: 500.0000 mg | ORAL_TABLET | Freq: Two times a day (BID) | ORAL | 0 refills | Status: AC

## 2017-02-03 MED ORDER — OXYCODONE-ACETAMINOPHEN 5-325 MG PO TABS
1.0000 | ORAL_TABLET | Freq: Once | ORAL | Status: AC
  Administered 2017-02-03: 1 via ORAL
  Filled 2017-02-03: qty 1

## 2017-02-03 NOTE — ED Notes (Signed)
  [id number]  

## 2017-02-03 NOTE — ED Triage Notes (Signed)
Pt sts lower back pain chronic in nature

## 2017-02-03 NOTE — ED Notes (Signed)
Patient transported to X-ray 

## 2017-02-03 NOTE — Discharge Instructions (Signed)
Follow-up with her family doctor or Dr. Eulah PontMurphy for continued problems with your back

## 2017-02-03 NOTE — ED Provider Notes (Signed)
[facility]-EMERGENCY DEPT Provider Note   CSN: [id number] Arrival date & time: 02/03/17  1358  By signing my name below, I, Teofilo PodMatthew P. Jamison, attest that this documentation has been prepared under the direction and in the presence of Bethann BerkshireJoseph Riki Gehring, MD . Electronically Signed: Teofilo PodMatthew P. Jamison, ED Scribe. 02/03/2017. 3:40 PM.    History   Chief Complaint Chief Complaint  Patient presents with  . Back Pain    The history is provided by the patient. No language interpreter was used.  Back Pain   This is a new problem. The current episode started more than 2 days ago. The problem occurs constantly. The problem has not changed since onset.The quality of the pain is described as burning. The pain is severe. Pertinent negatives include no chest pain, no headaches and no abdominal pain. He has tried nothing for the symptoms.   HPI Comments:  Peter Harrison is a 46 y.o. male who presents to the Emergency Department complaining of constant back pain x 6 days. Pt states that today was the first time he got out of bed in 6 days. Pt describest the pain as "like someone is burning a cigarette on his back." Pt reports a previous spinal injury that was treated by a chiropractor. Pt works as a Estate agentforklift operator. No alleviating factors noted. Pt denies other associated symptoms.   Past Medical History:  Diagnosis Date  . GERD (gastroesophageal reflux disease)     There are no active problems to display for this patient.   History reviewed. No pertinent surgical history.     Home Medications    Prior to Admission medications   Medication Sig Start Date End Date Taking? Authorizing Provider  HYDROcodone-acetaminophen (NORCO/VICODIN) 5-325 MG per tablet Take 1 tablet by mouth every 6 (six) hours as needed for severe pain. 05/10/15   Gerhard Munchobert Lockwood, MD  omeprazole (PRILOSEC) 20 MG capsule Take 1 capsule (20 mg total) by mouth daily. 05/10/15   Gerhard Munchobert Lockwood, MD  sucralfate (CARAFATE) 1 GM/10ML  suspension Take 10 mLs (1 g total) by mouth 4 (four) times daily -  with meals and at bedtime. 05/10/15 05/16/15  Gerhard Munchobert Lockwood, MD    Family History History reviewed. No pertinent family history.  Social History Social History  Substance Use Topics  . Smoking status: Current Every Day Smoker  . Smokeless tobacco: Not on file  . Alcohol use Yes     Comment: daily      Allergies   Patient has no known allergies.   Review of Systems Review of Systems  Constitutional: Negative for appetite change and fatigue.  HENT: Negative for congestion, ear discharge and sinus pressure.   Eyes: Negative for discharge.  Respiratory: Negative for cough.   Cardiovascular: Negative for chest pain.  Gastrointestinal: Negative for abdominal pain and diarrhea.  Genitourinary: Negative for frequency and hematuria.  Musculoskeletal: Positive for back pain.  Skin: Negative for rash.  Neurological: Negative for seizures and headaches.  Psychiatric/Behavioral: Negative for hallucinations.     Physical Exam Updated Vital Signs BP 128/91 (BP Location: Right Arm)   Pulse 74   Temp 98.3 F (36.8 C) (Oral)   Resp 18   SpO2 98%   Physical Exam  Constitutional: He is oriented to person, place, and time. He appears well-developed.  HENT:  Head: Normocephalic.  Eyes: Conjunctivae and EOM are normal. No scleral icterus.  Neck: Neck supple. No thyromegaly present.  Cardiovascular: Normal rate and regular rhythm.  Exam reveals no gallop and no  friction rub.   No murmur heard. Pulmonary/Chest: No stridor. He has no wheezes. He has no rales. He exhibits no tenderness.  Abdominal: He exhibits no distension. There is no tenderness. There is no rebound.  Musculoskeletal: Normal range of motion. He exhibits no edema.  Moderate lower lumbar tenderness.   Lymphadenopathy:    He has no cervical adenopathy.  Neurological: He is oriented to person, place, and time. He exhibits normal muscle tone.  Coordination normal.  Skin: No rash noted. No erythema.  Psychiatric: He has a normal mood and affect. His behavior is normal.     ED Treatments / Results  DIAGNOSTIC STUDIES:  Oxygen Saturation is 98% on RA, normal by my interpretation.    COORDINATION OF CARE:  3:40 PM Will order xray of back. Discussed treatment plan with pt at bedside and pt agreed to plan.   Labs (all labs ordered are listed, but only abnormal results are displayed) Labs Reviewed - No data to display  EKG  EKG Interpretation None       Radiology No results found.  Procedures Procedures (including critical care time)  Medications Ordered in ED Medications - No data to display   Initial Impression / Assessment and Plan / ED Course  I have reviewed the triage vital signs and the nursing notes.  Pertinent labs & imaging results that were available during my care of the patient were reviewed by me and considered in my medical decision making (see chart for details).     Patient has lumbar strain with mild sciatica. He'll be put on Naprosyn and Ultram will follow-up as needed  Final Clinical Impressions(s) / ED Diagnoses   Final diagnoses:  None    New Prescriptions New Prescriptions   No medications on file  The chart was scribed for me under my direct supervision.  I personally performed the history, physical, and medical decision making and all procedures in the evaluation of this patient.Bethann Berkshire, MD 02/03/17 [phone]

## 2021-04-17 ENCOUNTER — Emergency Department (HOSPITAL_BASED_OUTPATIENT_CLINIC_OR_DEPARTMENT_OTHER)
Admission: EM | Admit: 2021-04-17 | Discharge: 2021-04-17 | Disposition: A | Discharge status: Home / Self Care | Payer: Self-pay | Attending: Emergency Medicine | Admitting: Emergency Medicine

## 2021-04-17 ENCOUNTER — Encounter (HOSPITAL_BASED_OUTPATIENT_CLINIC_OR_DEPARTMENT_OTHER): Payer: Self-pay

## 2021-04-17 ENCOUNTER — Other Ambulatory Visit: Payer: Self-pay

## 2021-04-17 ENCOUNTER — Emergency Department (HOSPITAL_BASED_OUTPATIENT_CLINIC_OR_DEPARTMENT_OTHER): Payer: Self-pay

## 2021-04-17 DIAGNOSIS — R0789 Other chest pain: Secondary | ICD-10-CM | POA: Insufficient documentation

## 2021-04-17 DIAGNOSIS — E860 Dehydration: Secondary | ICD-10-CM | POA: Insufficient documentation

## 2021-04-17 DIAGNOSIS — R252 Cramp and spasm: Secondary | ICD-10-CM | POA: Insufficient documentation

## 2021-04-17 DIAGNOSIS — Z87891 Personal history of nicotine dependence: Secondary | ICD-10-CM | POA: Insufficient documentation

## 2021-04-17 LAB — CBC WITH DIFFERENTIAL/PLATELET
Abs Immature Granulocytes: 0.06 10*3/uL (ref 0.00–0.07)
Basophils Absolute: 0.2 10*3/uL — ABNORMAL HIGH (ref 0.0–0.1)
Basophils Relative: 2 %
Eosinophils Absolute: 0.2 10*3/uL (ref 0.0–0.5)
Eosinophils Relative: 2 %
HCT: 50.6 % (ref 39.0–52.0)
Hemoglobin: 18.1 g/dL — ABNORMAL HIGH (ref 13.0–17.0)
Immature Granulocytes: 0 %
Lymphocytes Relative: 18 %
Lymphs Abs: 2.6 10*3/uL (ref 0.7–4.0)
MCH: 28.9 pg (ref 26.0–34.0)
MCHC: 35.8 g/dL (ref 30.0–36.0)
MCV: 80.7 fL (ref 80.0–100.0)
Monocytes Absolute: 0.9 10*3/uL (ref 0.1–1.0)
Monocytes Relative: 6 %
Neutro Abs: 10.1 10*3/uL — ABNORMAL HIGH (ref 1.7–7.7)
Neutrophils Relative %: 72 %
Platelets: 896 10*3/uL — ABNORMAL HIGH (ref 150–400)
RBC: 6.27 MIL/uL — ABNORMAL HIGH (ref 4.22–5.81)
RDW: 15 % (ref 11.5–15.5)
WBC: 14.1 10*3/uL — ABNORMAL HIGH (ref 4.0–10.5)
nRBC: 0 % (ref 0.0–0.2)

## 2021-04-17 LAB — COMPREHENSIVE METABOLIC PANEL
ALT: 27 U/L (ref 0–44)
AST: 20 U/L (ref 15–41)
Albumin: 4.2 g/dL (ref 3.5–5.0)
Alkaline Phosphatase: 75 U/L (ref 38–126)
Anion gap: 10 (ref 5–15)
BUN: 15 mg/dL (ref 6–20)
CO2: 23 mmol/L (ref 22–32)
Calcium: 9.5 mg/dL (ref 8.9–10.3)
Chloride: 103 mmol/L (ref 98–111)
Creatinine, Ser: 1.23 mg/dL (ref 0.61–1.24)
GFR, Estimated: >60 mL/min (ref >60)
Glucose, Bld: 104 mg/dL — ABNORMAL HIGH (ref 70–99)
Potassium: 3.9 mmol/L (ref 3.5–5.1)
Sodium: 136 mmol/L (ref 135–145)
Total Bilirubin: 0.9 mg/dL (ref 0.3–1.2)
Total Protein: 7.4 g/dL (ref 6.5–8.1)

## 2021-04-17 LAB — LIPASE, BLOOD: Lipase: 26 U/L (ref 11–51)

## 2021-04-17 LAB — TROPONIN I (HIGH SENSITIVITY): Troponin I (High Sensitivity): 3 ng/L (ref <18)

## 2021-04-17 LAB — D-DIMER, QUANTITATIVE: D-Dimer, Quant: 0.32 ug/mL-FEU (ref 0.00–0.50)

## 2021-04-17 LAB — CK: Total CK: 49 U/L (ref 49–397)

## 2021-04-17 MED ORDER — PANTOPRAZOLE SODIUM 40 MG PO TBEC: 40.0000 mg | DELAYED_RELEASE_TABLET | Freq: Every day | ORAL | 0 refills | Status: AC

## 2021-04-17 MED ORDER — SODIUM CHLORIDE 0.9 % IV BOLUS
500.0000 mL | Freq: Once | INTRAVENOUS | Status: AC
  Administered 2021-04-17: 500 mL via INTRAVENOUS

## 2021-04-17 NOTE — ED Provider Notes (Signed)
Emergency Department Provider Note   I have reviewed the triage vital signs and the nursing notes.   HISTORY  Chief Complaint Chest Pain   HPI Peter Harrison is a 50 y.o. male presents to the emerge department for evaluation of chest discomfort.  Pain is in the center of the chest and radiating slightly to the left.  It is intermittent and occasionally made worse with deep breathing but also sometimes with movement.  He has difficulty describing that particular sensation in the chest but describes it as sometimes sharp but also sometimes pressure.  He also feels a "reflux" type sensation in his lower chest and abdomen at times.  No vomiting, diaphoresis, diarrhea.  No syncope. No radiation of symptoms or modifying factors.   Patient also complaining of some cramping in the bilateral, posterior thighs.  No leg swelling or unilateral pain.  No pain behind the knees or calves.  No cramping in the other extremities.    Past Medical History:  Diagnosis Date  . GERD (gastroesophageal reflux disease)     There are no problems to display for this patient.   History reviewed. No pertinent surgical history.  Allergies Patient has no known allergies.  No family history on file.  Social History Social History   Tobacco Use  . Smoking status: Former Smoker    Types: Cigars  . Smokeless tobacco: Never Used  . Tobacco comment: quit March 2022  Substance Use Topics  . Alcohol use: Yes    Comment: weekly  . Drug use: Yes    Types: Marijuana    Review of Systems  Constitutional: No fever/chills. Eyes: No visual changes. ENT: No sore throat. Cardiovascular: Positive chest pain. Respiratory: Denies shortness of breath. Gastrointestinal: No abdominal pain. No nausea, no vomiting. No diarrhea. No constipation. Genitourinary: Negative for dysuria. Musculoskeletal: Negative for back pain. Skin: Negative for rash. Neurological: Negative for headaches, focal weakness or  numbness.  10-point ROS otherwise negative.  ____________________________________________   PHYSICAL EXAM:  VITAL SIGNS: ED Triage Vitals  Enc Vitals Group     BP 04/17/21 1329 (!) 123/95     Pulse Rate 04/17/21 1329 67     Resp 04/17/21 1329 18     Temp 04/17/21 1329 98.9 F (37.2 C)     Temp Source 04/17/21 1329 Oral     SpO2 04/17/21 1329 98 %     Weight 04/17/21 1328 179 lb (81.2 kg)     Height 04/17/21 1328 5\' 9"  (1.753 m)   Constitutional: Alert and oriented. Well appearing and in no acute distress. Eyes: Conjunctivae are normal.  Head: Atraumatic. Nose: No congestion/rhinnorhea. Mouth/Throat: Mucous membranes are moist.  Neck: No stridor.  Cardiovascular: Normal rate, regular rhythm. Good peripheral circulation. Grossly normal heart sounds.   Respiratory: Normal respiratory effort.  No retractions. Lungs CTAB. Gastrointestinal: Soft and nontender. No distention.  Musculoskeletal: No lower extremity tenderness nor edema. No gross deformities of extremities. Neurologic:  Normal speech and language. No gross focal neurologic deficits are appreciated.  Skin:  Skin is warm, dry and intact. No rash noted.  ____________________________________________   LABS (all labs ordered are listed, but only abnormal results are displayed)  Labs Reviewed  COMPREHENSIVE METABOLIC PANEL - Abnormal; Notable for the following components:      Result Value   Glucose, Bld 104 (*)    All other components within normal limits  CBC WITH DIFFERENTIAL/PLATELET - Abnormal; Notable for the following components:   WBC 14.1 (*)  RBC 6.27 (*)    Hemoglobin 18.1 (*)    Platelets 896 (*)    Neutro Abs 10.1 (*)    Basophils Absolute 0.2 (*)    All other components within normal limits  LIPASE, BLOOD  D-DIMER, QUANTITATIVE  CK  TROPONIN I (HIGH SENSITIVITY)   ____________________________________________  EKG   EKG Interpretation  Date/Time:  Tuesday April 17 2021 13:27:56  EDT Ventricular Rate:  70 PR Interval:  140 QRS Duration: 92 QT Interval:  378 QTC Calculation: 408 R Axis:   37 Text Interpretation: Sinus rhythm Consider left ventricular hypertrophy Confirmed by Alona Bene [phone]) on 04/17/2021 1:33:50 PM Also confirmed by Alona Bene [phone]), editor [place], [place] ([id number])  on 04/18/2021 10:05:53 AM       ____________________________________________  RADIOLOGY  CXR reviewed.  ____________________________________________   PROCEDURES  Procedure(s) performed:   Procedures  None ____________________________________________   INITIAL IMPRESSION / ASSESSMENT AND PLAN / ED COURSE  Pertinent labs & imaging results that were available during my care of the patient were reviewed by me and considered in my medical decision making (see chart for details).   Patient presents to the emergency department with fairly atypical chest pain over the past several days which is occurring intermittently.  He is also having some cramping in the back of the legs bilaterally. Will add CK and give IVF.   CK normal. All cell lines elevated on CBC. Question some component of dehydration. Feeling improved after IVF. Plan for PCP follow up. Contact information provided at discharge.  ____________________________________________  FINAL CLINICAL IMPRESSION(S) / ED DIAGNOSES  Final diagnoses:  Atypical chest pain  Leg cramping  Dehydration     MEDICATIONS GIVEN DURING THIS VISIT:  Medications  sodium chloride 0.9 % bolus 500 mL ( Intravenous Stopped 04/17/21 1508)     NEW OUTPATIENT MEDICATIONS STARTED DURING THIS VISIT:  Discharge Medication List as of 04/17/2021  2:47 PM    START taking these medications   Details  pantoprazole (PROTONIX) 40 MG tablet Take 1 tablet (40 mg total) by mouth daily., Starting Tue 04/17/2021, Until Thu 05/17/2021, Normal        Note:  This document was prepared using Dragon voice recognition software and may include  unintentional dictation errors.  Alona Bene, MD, [facility]    Sion Reinders, Arlyss Repress, MD 04/20/21 [phone]

## 2021-04-17 NOTE — Discharge Instructions (Signed)
You were seen in the emerge department today with chest discomfort.  Your lab work is showing some dehydration but no other findings to suggest a heart attack or blood clot in your lungs.  Please drink plenty of hydrating fluids such as water or Pedialyte.  I would like for you to follow with a primary care doctor.  I have listed the name of primary care practice to call to schedule a appointment as soon as possible.  If your symptoms continue you may require additional testing or referral beyond what we did here in the emergency department.  If her chest pain returns, becomes more severe, you develop shortness of breath, or other severe symptoms you should return to the emergency department for reevaluation.

## 2021-04-17 NOTE — ED Notes (Signed)
Pharmacy and medications updated with patient. Pt denies daily meds

## 2021-04-17 NOTE — ED Notes (Signed)
Pt discharged to home. Discharge instructions have been discussed with patient and/or family members. Pt verbally acknowledges understanding d/c instructions, and endorses comprehension to checkout at registration before leaving.  °

## 2021-04-17 NOTE — ED Triage Notes (Addendum)
Pt c/o left side CP, bilat LE pain x 3 days-states CP is worse with a deep breath and sometimes when he swallows-denies fever/flu sx-NAD-steady gait

## 2022-06-29 IMAGING — DX DG CHEST 1V PORT
1 series · 1 of 1 positions shown · non-contrast
Comparison: None.

CLINICAL DATA: Chest pain, left arm pain.

EXAM:
PORTABLE CHEST 1 VIEW

[chest ap]
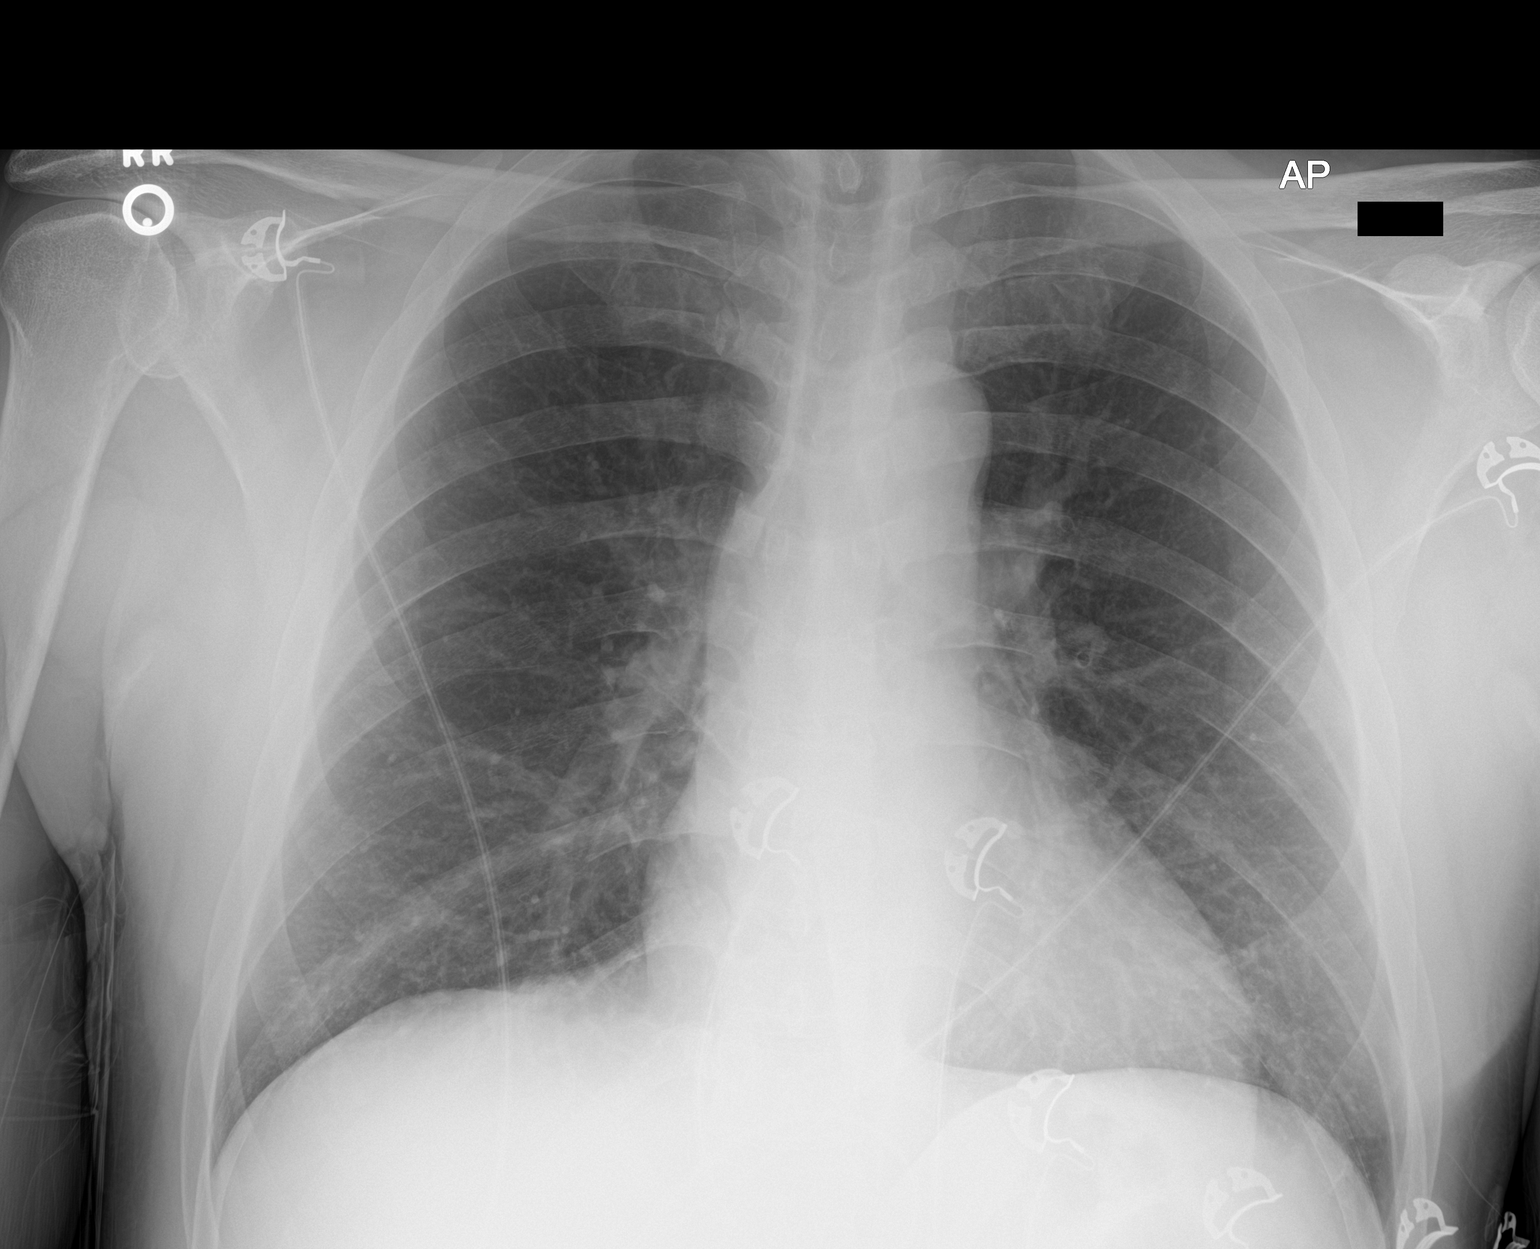

[1 of 1 positions shown; findings below may reference images not displayed]

FINDINGS: The heart size and mediastinal contours are within normal limits.
Both lungs are clear. The visualized skeletal structures are
unremarkable.
IMPRESSION: No active disease.

## 2023-02-06 ENCOUNTER — Other Ambulatory Visit: Payer: Self-pay

## 2023-02-06 ENCOUNTER — Encounter (HOSPITAL_BASED_OUTPATIENT_CLINIC_OR_DEPARTMENT_OTHER): Payer: Self-pay

## 2023-02-06 ENCOUNTER — Emergency Department (HOSPITAL_BASED_OUTPATIENT_CLINIC_OR_DEPARTMENT_OTHER)
Admission: EM | Admit: 2023-02-06 | Discharge: 2023-02-06 | Disposition: A | Discharge status: Home / Self Care | Payer: 59 | Attending: Emergency Medicine | Admitting: Emergency Medicine

## 2023-02-06 DIAGNOSIS — R799 Abnormal finding of blood chemistry, unspecified: Secondary | ICD-10-CM | POA: Insufficient documentation

## 2023-02-06 DIAGNOSIS — Z79899 Other long term (current) drug therapy: Secondary | ICD-10-CM | POA: Insufficient documentation

## 2023-02-06 DIAGNOSIS — D72829 Elevated white blood cell count, unspecified: Secondary | ICD-10-CM | POA: Insufficient documentation

## 2023-02-06 DIAGNOSIS — F17210 Nicotine dependence, cigarettes, uncomplicated: Secondary | ICD-10-CM | POA: Diagnosis not present

## 2023-02-06 DIAGNOSIS — I10 Essential (primary) hypertension: Secondary | ICD-10-CM | POA: Diagnosis not present

## 2023-02-06 DIAGNOSIS — R899 Unspecified abnormal finding in specimens from other organs, systems and tissues: Secondary | ICD-10-CM

## 2023-02-06 LAB — COMPREHENSIVE METABOLIC PANEL
ALT: 31 U/L (ref 0–44)
AST: 30 U/L (ref 15–41)
Albumin: 4.5 g/dL (ref 3.5–5.0)
Alkaline Phosphatase: 90 U/L (ref 38–126)
Anion gap: 10 (ref 5–15)
BUN: 13 mg/dL (ref 6–20)
CO2: 27 mmol/L (ref 22–32)
Calcium: 9.3 mg/dL (ref 8.9–10.3)
Chloride: 103 mmol/L (ref 98–111)
Creatinine, Ser: 1.11 mg/dL (ref 0.61–1.24)
GFR, Estimated: >60 mL/min (ref >60)
Glucose, Bld: 102 mg/dL — ABNORMAL HIGH (ref 70–99)
Potassium: 3.9 mmol/L (ref 3.5–5.1)
Sodium: 140 mmol/L (ref 135–145)
Total Bilirubin: 0.9 mg/dL (ref 0.3–1.2)
Total Protein: 7.5 g/dL (ref 6.5–8.1)

## 2023-02-06 LAB — CBC WITH DIFFERENTIAL/PLATELET
Abs Immature Granulocytes: 0.07 10*3/uL (ref 0.00–0.07)
Basophils Absolute: 0.2 10*3/uL — ABNORMAL HIGH (ref 0.0–0.1)
Basophils Relative: 2 %
Eosinophils Absolute: 0.1 10*3/uL (ref 0.0–0.5)
Eosinophils Relative: 1 %
HCT: 48.8 % (ref 39.0–52.0)
Hemoglobin: 17.5 g/dL — ABNORMAL HIGH (ref 13.0–17.0)
Immature Granulocytes: 1 %
Lymphocytes Relative: 12 %
Lymphs Abs: 1.8 10*3/uL (ref 0.7–4.0)
MCH: 29 pg (ref 26.0–34.0)
MCHC: 35.9 g/dL (ref 30.0–36.0)
MCV: 80.9 fL (ref 80.0–100.0)
Monocytes Absolute: 0.5 10*3/uL (ref 0.1–1.0)
Monocytes Relative: 4 %
Neutro Abs: 12.6 10*3/uL — ABNORMAL HIGH (ref 1.7–7.7)
Neutrophils Relative %: 80 %
Platelets: 981 10*3/uL (ref 150–400)
RBC: 6.03 MIL/uL — ABNORMAL HIGH (ref 4.22–5.81)
RDW: 16.5 % — ABNORMAL HIGH (ref 11.5–15.5)
Smear Review: INCREASED
WBC: 15.3 10*3/uL — ABNORMAL HIGH (ref 4.0–10.5)
nRBC: 0 % (ref 0.0–0.2)

## 2023-02-06 MED ORDER — SODIUM CHLORIDE 0.9 % IV BOLUS
1000.0000 mL | Freq: Once | INTRAVENOUS | Status: AC
  Administered 2023-02-06: 1000 mL via INTRAVENOUS

## 2023-02-06 NOTE — ED Provider Notes (Signed)
[facility] Provider Note   CSN: [id number] Arrival date & time: 02/06/23  1023     History  Chief Complaint  Patient presents with   Abnormal Lab    Peter Harrison is a 52 y.o. male.   Abnormal Lab   52 year old male presents emergency department with complaints of abnormal lab value.  Patient with routine laboratory studies performed yesterday by oncologist and was called earlier today with a critically high potassium of 6.9 total, emergency department.  Patient reports  1 episode of emesis earlier today but is generally feeling well currently is only changing current symptoms.  Patient denies fever, chills, night sweats, chest pain, shortness of breath, abdominal pain, nausea, urinary symptoms, change in bowel habits.  Patient not currently on immunosuppressive medication.  Patient with history of GERD, right-sided neck mass being followed by oncology of unknown origin currently.  Home Medications Prior to Admission medications   Medication Sig Start Date End Date Taking? Authorizing Provider  HYDROcodone-acetaminophen (NORCO/VICODIN) 5-325 MG per tablet Take 1 tablet by mouth every 6 (six) hours as needed for severe pain. 05/10/15   Carmin Muskrat, MD  naproxen (NAPROSYN) 500 MG tablet Take 1 tablet (500 mg total) by mouth 2 (two) times daily. 02/03/17   Milton Ferguson, MD  omeprazole (PRILOSEC) 20 MG capsule Take 1 capsule (20 mg total) by mouth daily. 05/10/15   Carmin Muskrat, MD  pantoprazole (PROTONIX) 40 MG tablet Take 1 tablet (40 mg total) by mouth daily. 04/17/21 05/17/21  Long, Wonda Olds, MD  sucralfate (CARAFATE) 1 GM/10ML suspension Take 10 mLs (1 g total) by mouth 4 (four) times daily -  with meals and at bedtime. 05/10/15 05/16/15  Carmin Muskrat, MD  traMADol (ULTRAM) 50 MG tablet Take 1 tablet (50 mg total) by mouth every 6 (six) hours as needed. 02/03/17   Milton Ferguson, MD      Allergies    Patient has no known  allergies.    Review of Systems   Review of Systems  All other systems reviewed and are negative.   Physical Exam Updated Vital Signs BP (!) 137/98   Pulse 63   Temp 98.5 F (36.9 C) (Oral)   Resp 18   Ht 5' 9"$  (1.753 m)   Wt 76.2 kg   SpO2 97%   BMI 24.81 kg/m  Physical Exam Vitals and nursing note reviewed.  Constitutional:      General: He is not in acute distress.    Appearance: He is well-developed.  HENT:     Head: Normocephalic and atraumatic.  Eyes:     Conjunctiva/sclera: Conjunctivae normal.  Cardiovascular:     Rate and Rhythm: Normal rate and regular rhythm.     Heart sounds: No murmur heard. Pulmonary:     Effort: Pulmonary effort is normal. No respiratory distress.     Breath sounds: Normal breath sounds.  Abdominal:     Palpations: Abdomen is soft.     Tenderness: There is no abdominal tenderness.  Musculoskeletal:        General: No swelling.     Cervical back: Neck supple.     Right lower leg: No edema.     Left lower leg: No edema.  Skin:    General: Skin is warm and dry.     Capillary Refill: Capillary refill takes less than 2 seconds.  Neurological:     Mental Status: He is alert.  Psychiatric:  Mood and Affect: Mood normal.     ED Results / Procedures / Treatments   Labs (all labs ordered are listed, but only abnormal results are displayed) Labs Reviewed  COMPREHENSIVE METABOLIC PANEL - Abnormal; Notable for the following components:      Result Value   Glucose, Bld 102 (*)    All other components within normal limits  CBC WITH DIFFERENTIAL/PLATELET - Abnormal; Notable for the following components:   WBC 15.3 (*)    RBC 6.03 (*)    Hemoglobin 17.5 (*)    RDW 16.5 (*)    Platelets 981 (*)    Neutro Abs 12.6 (*)    Basophils Absolute 0.2 (*)    All other components within normal limits  CBC WITH DIFFERENTIAL/PLATELET  PATHOLOGIST SMEAR REVIEW    EKG EKG Interpretation  Date/Time:  Thursday February 06 2023 10:36:28  EST Ventricular Rate:  71 PR Interval:  128 QRS Duration: 95 QT Interval:  402 QTC Calculation: 437 R Axis:   24 Text Interpretation: Sinus rhythm Confirmed by Georgina Snell [phone]) on 02/06/2023 10:38:51 AM  Radiology No results found.  Procedures Procedures    Medications Ordered in ED Medications  sodium chloride 0.9 % bolus 1,000 mL ( Intravenous Stopped 02/06/23 1157)    ED Course/ Medical Decision Making/ A&P                             Medical Decision Making Amount and/or Complexity of Data Reviewed Labs: ordered.   This patient presents to the ED for concern of abnormal lab, this involves an extensive number of treatment options, and is a complaint that carries with it a high risk of complications and morbidity.  The differential diagnosis includes abnormal lab   Co morbidities that complicate the patient evaluation  See HPI   Additional history obtained:  Additional history obtained from EMR External records from outside source obtained and reviewed including hospital records   Lab Tests:  I Ordered, and personally interpreted labs.  The pertinent results include: Leukocytosis, thrombocytosis and increase in hemoglobin of 15.3, 91 and 17.5 respectively.  No electrolyte abnormalities.  No transaminitis.  No renal dysfunction.   Imaging Studies ordered:  N/a   Cardiac Monitoring: / EKG:  The patient was maintained on a cardiac monitor.  I personally viewed and interpreted the cardiac monitored which showed an underlying rhythm of: Sinus rhythm without acute ischemic changes   Consultations Obtained:  I requested consultation with attending physician Dr. Nechama Guard who is in agreement with treatment plan going forward   Problem List / ED Course / Critical interventions / Medication management  Abnormal lab Reevaluation of the patient showed that the patient stayed the same I have reviewed the patients home medicines and have made adjustments  as needed   Social Determinants of Health:  Chronic cigarette use.  Denies illicit drug use.   Test / Admission - Considered:  Abnormal lab Vitals signs significant for mild hypertension with blood pressure 137/98.  Recommend follow-up with primary care regarding elevation blood pressure.. Otherwise within normal range and stable throughout visit. Laboratory/imaging studies significant for: See above Patient without evidence of current hyperkalemia.  Most likely hemolyzed sample sent by oncologist outpatient.  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  Patient currently without symptoms indicative of ACS, CVA, concern for noticeable microvascular disturbance given patient's significant thrombocytosis.  Patient on daily aspirin already as antiplatelet agent.  Patient's laboratory studies unchanged from prior performed with no acute change in symptoms since heme-onc visit yesterday.  Patient deemed safe for discharge.  Strict return precautions were discussed at length with patient.  Treatment plan discussed at length with the patient and he acknowledged understanding was agreeable to said plan. Worrisome signs and symptoms were discussed the patient and the patient knowledge understanding was to return to emergency department if noticed        Final Clinical Impression(s) / ED Diagnoses Final diagnoses:  Abnormal laboratory test    Rx / DC Orders ED Discharge Orders     None         Wilnette Kales, [place] 02/06/23 1637    Elgie Congo, MD 02/06/23 1747

## 2023-02-06 NOTE — Discharge Instructions (Signed)
Note the workup today was overall reassuring.  As discussed, laboratory study done by oncologist most likely falsely elevated due to poor sample.  Your potassium levels within normal range today.  Recommend follow-up with oncologist outpatient for reevaluation of your symptoms.  Continue to take your medicines as prescribed.  Please do not hesitate to return to emergency department for worrisome signs and symptoms we discussed become apparent.

## 2023-02-06 NOTE — ED Notes (Signed)
Discharge instructions reviewed with patient. Patient verbalizes understanding, no further questions at this time. Medications and follow up information provided. No acute distress noted at time of departure.  

## 2023-02-06 NOTE — ED Triage Notes (Addendum)
Had labwork drawn yesterday, received call from his doctor to come to ED as his potassium was "extremely high". Denies any complaints, except vomited one time this morning.

## 2023-02-10 LAB — PATHOLOGIST SMEAR REVIEW
